# Patient Record
Sex: Female | Born: 1965 | Race: White | Hispanic: No | Marital: Married | Smoking: Never smoker
Health system: Southern US, Community
[De-identification: ages and names within clinical notes are randomized; demographics above are authoritative.]

## PROBLEM LIST (undated history)

## (undated) DIAGNOSIS — K219 Gastro-esophageal reflux disease without esophagitis: Secondary | ICD-10-CM

## (undated) DIAGNOSIS — I10 Essential (primary) hypertension: Secondary | ICD-10-CM

## (undated) DIAGNOSIS — T7840XA Allergy, unspecified, initial encounter: Secondary | ICD-10-CM

## (undated) HISTORY — PX: TONSILLECTOMY: SUR1361

## (undated) HISTORY — DX: Essential (primary) hypertension: I10

## (undated) HISTORY — PX: BACK SURGERY: SHX140

## (undated) HISTORY — DX: Allergy, unspecified, initial encounter: T78.40XA

## (undated) HISTORY — PX: CHOLECYSTECTOMY: SHX55

## (undated) HISTORY — DX: Gastro-esophageal reflux disease without esophagitis: K21.9

---

## 2015-09-28 DIAGNOSIS — Z6831 Body mass index (BMI) 31.0-31.9, adult: Secondary | ICD-10-CM | POA: Diagnosis not present

## 2015-09-28 DIAGNOSIS — G43009 Migraine without aura, not intractable, without status migrainosus: Secondary | ICD-10-CM | POA: Diagnosis not present

## 2015-09-28 DIAGNOSIS — I10 Essential (primary) hypertension: Secondary | ICD-10-CM | POA: Diagnosis not present

## 2016-03-28 DIAGNOSIS — Z1231 Encounter for screening mammogram for malignant neoplasm of breast: Secondary | ICD-10-CM | POA: Diagnosis not present

## 2016-04-25 DIAGNOSIS — Z6832 Body mass index (BMI) 32.0-32.9, adult: Secondary | ICD-10-CM | POA: Diagnosis not present

## 2016-04-25 DIAGNOSIS — L570 Actinic keratosis: Secondary | ICD-10-CM | POA: Diagnosis not present

## 2016-05-27 DIAGNOSIS — Z01419 Encounter for gynecological examination (general) (routine) without abnormal findings: Secondary | ICD-10-CM | POA: Diagnosis not present

## 2016-05-27 DIAGNOSIS — G43001 Migraine without aura, not intractable, with status migrainosus: Secondary | ICD-10-CM | POA: Diagnosis not present

## 2016-10-10 DIAGNOSIS — G43119 Migraine with aura, intractable, without status migrainosus: Secondary | ICD-10-CM | POA: Diagnosis not present

## 2016-10-10 DIAGNOSIS — Z6833 Body mass index (BMI) 33.0-33.9, adult: Secondary | ICD-10-CM | POA: Diagnosis not present

## 2017-02-16 DIAGNOSIS — Z6833 Body mass index (BMI) 33.0-33.9, adult: Secondary | ICD-10-CM | POA: Diagnosis not present

## 2017-02-16 DIAGNOSIS — N763 Subacute and chronic vulvitis: Secondary | ICD-10-CM | POA: Diagnosis not present

## 2017-03-18 DIAGNOSIS — M722 Plantar fascial fibromatosis: Secondary | ICD-10-CM | POA: Diagnosis not present

## 2017-03-18 DIAGNOSIS — M79671 Pain in right foot: Secondary | ICD-10-CM | POA: Diagnosis not present

## 2017-04-07 DIAGNOSIS — Z1231 Encounter for screening mammogram for malignant neoplasm of breast: Secondary | ICD-10-CM | POA: Diagnosis not present

## 2017-04-24 DIAGNOSIS — Z6833 Body mass index (BMI) 33.0-33.9, adult: Secondary | ICD-10-CM | POA: Diagnosis not present

## 2017-04-24 DIAGNOSIS — Z0000 Encounter for general adult medical examination without abnormal findings: Secondary | ICD-10-CM | POA: Diagnosis not present

## 2017-04-24 DIAGNOSIS — G43119 Migraine with aura, intractable, without status migrainosus: Secondary | ICD-10-CM | POA: Diagnosis not present

## 2017-04-24 DIAGNOSIS — M722 Plantar fascial fibromatosis: Secondary | ICD-10-CM | POA: Diagnosis not present

## 2017-04-24 DIAGNOSIS — M79672 Pain in left foot: Secondary | ICD-10-CM | POA: Diagnosis not present

## 2017-05-28 DIAGNOSIS — Z6834 Body mass index (BMI) 34.0-34.9, adult: Secondary | ICD-10-CM | POA: Diagnosis not present

## 2017-05-28 DIAGNOSIS — Z01419 Encounter for gynecological examination (general) (routine) without abnormal findings: Secondary | ICD-10-CM | POA: Diagnosis not present

## 2017-10-02 DIAGNOSIS — R6889 Other general symptoms and signs: Secondary | ICD-10-CM | POA: Diagnosis not present

## 2017-10-02 DIAGNOSIS — R05 Cough: Secondary | ICD-10-CM | POA: Diagnosis not present

## 2017-10-02 DIAGNOSIS — J40 Bronchitis, not specified as acute or chronic: Secondary | ICD-10-CM | POA: Diagnosis not present

## 2017-10-02 DIAGNOSIS — J029 Acute pharyngitis, unspecified: Secondary | ICD-10-CM | POA: Diagnosis not present

## 2017-10-29 DIAGNOSIS — J40 Bronchitis, not specified as acute or chronic: Secondary | ICD-10-CM | POA: Diagnosis not present

## 2017-10-29 DIAGNOSIS — N390 Urinary tract infection, site not specified: Secondary | ICD-10-CM | POA: Diagnosis not present

## 2017-10-29 DIAGNOSIS — M545 Low back pain: Secondary | ICD-10-CM | POA: Diagnosis not present

## 2017-10-29 DIAGNOSIS — G43109 Migraine with aura, not intractable, without status migrainosus: Secondary | ICD-10-CM | POA: Diagnosis not present

## 2018-04-09 DIAGNOSIS — Z1231 Encounter for screening mammogram for malignant neoplasm of breast: Secondary | ICD-10-CM | POA: Diagnosis not present

## 2018-04-28 DIAGNOSIS — Z0000 Encounter for general adult medical examination without abnormal findings: Secondary | ICD-10-CM | POA: Diagnosis not present

## 2018-04-28 DIAGNOSIS — Z6835 Body mass index (BMI) 35.0-35.9, adult: Secondary | ICD-10-CM | POA: Diagnosis not present

## 2018-05-13 DIAGNOSIS — Z6834 Body mass index (BMI) 34.0-34.9, adult: Secondary | ICD-10-CM | POA: Diagnosis not present

## 2018-05-13 DIAGNOSIS — M5431 Sciatica, right side: Secondary | ICD-10-CM | POA: Diagnosis not present

## 2018-05-17 DIAGNOSIS — M5126 Other intervertebral disc displacement, lumbar region: Secondary | ICD-10-CM | POA: Diagnosis not present

## 2018-05-17 DIAGNOSIS — M48061 Spinal stenosis, lumbar region without neurogenic claudication: Secondary | ICD-10-CM | POA: Diagnosis not present

## 2018-05-19 ENCOUNTER — Ambulatory Visit
Admission: RE | Admit: 2018-05-19 | Discharge: 2018-05-19 | Disposition: A | Discharge status: Home / Self Care | Payer: BLUE CROSS/BLUE SHIELD | Source: Ambulatory Visit | Attending: Internal Medicine | Admitting: Internal Medicine

## 2018-05-19 ENCOUNTER — Other Ambulatory Visit: Payer: Self-pay | Admitting: Internal Medicine

## 2018-05-19 DIAGNOSIS — M545 Low back pain, unspecified: Secondary | ICD-10-CM

## 2018-05-19 DIAGNOSIS — M5136 Other intervertebral disc degeneration, lumbar region: Secondary | ICD-10-CM | POA: Diagnosis not present

## 2018-05-19 IMAGING — XA DG EPIDURAL/NERVE ROOT
3 series · 3 of 3 positions shown · IV contrast (isovue)
Comparison: Recent MRI.

CLINICAL DATA: Spondylosis without myelopathy. Right radicular
pain, L2 distribution going to the hip and upper thigh. Imaging
shows left-sided predominant disc pathology at L2-3 and L3-4, but
the patient does not have any left-sided symptoms. Minor left
foraminal encroachment at L2-3 was demonstrated.

EXAM:
Left L2 nerve root block and trans foraminal epidural
TECHNIQUE: The overlying skin was prepped with Betadine and draped in sterile
fashion. Skin anesthesia was carried [DATE]% Lidocaine. A
curved 22 gauge spinal needle was directed into the superior ventral
neural foramen on the right at L2-3.Injection of a few drops of
Isovue 200 demonstrates spread outlining the right L2 nerve root. No
intravascular extension. 120mg Depo-Medrol and 2cc 1% Lidocaine were
subsequently administered. No apparent complication. The patient
tolerated the procedure without difficulty and was transferred to
recovery in excellent condition.
FLUOROSCOPY TIME:  0 minutes 52 seconds. 58.40 micro gray meter
squared

[Series 1: ortho adipose · 1 of 1 slices shown (1 of 3)]
[im 1/1]
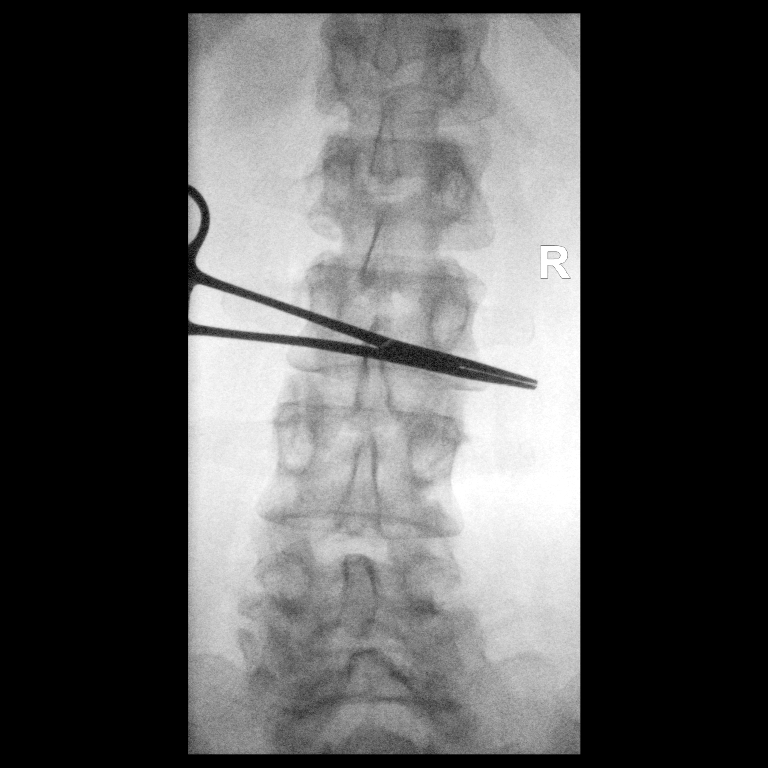

[Series 2: ortho adipose · 1 of 1 slices shown (2 of 3)]
[im 1/1]
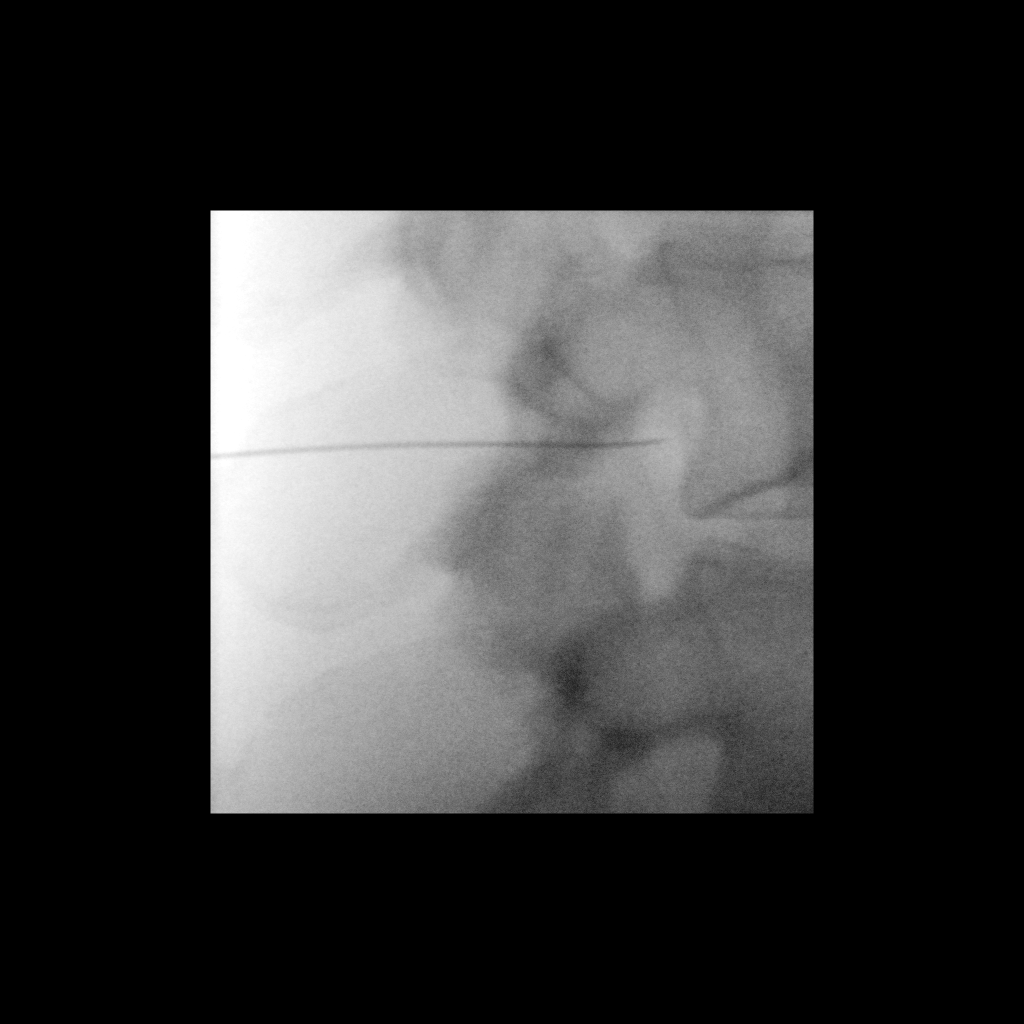

[Series 3: ortho adipose · 1 of 1 slices shown (3 of 3)]
[im 1/1]
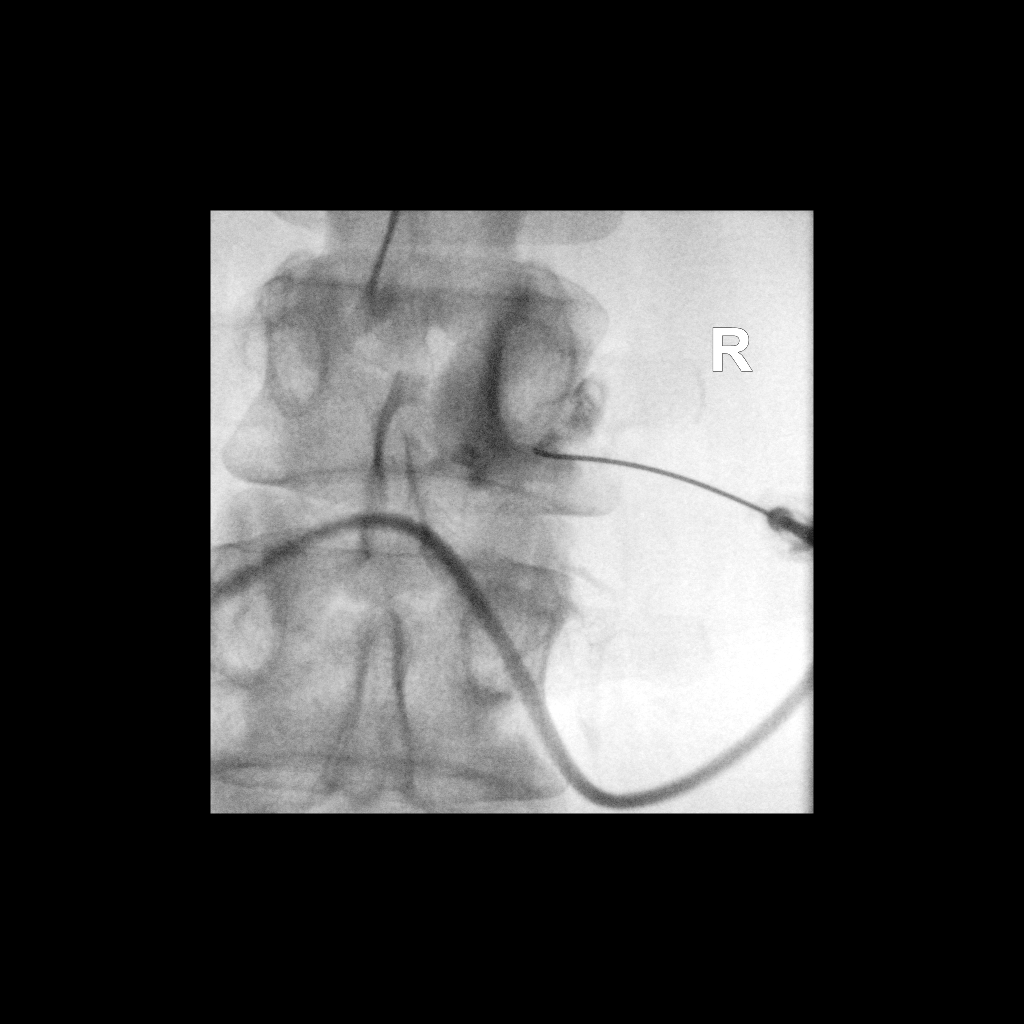

[3 of 3 positions shown; findings below may reference images not displayed]

IMPRESSION: Technically successful right L2 selective nerve root block and
transforaminal epidural steroid injection.

## 2018-05-19 MED ORDER — METHYLPREDNISOLONE ACETATE 40 MG/ML INJ SUSP (RADIOLOG
120.0000 mg | Freq: Once | INTRAMUSCULAR | Status: AC
  Administered 2018-05-19: 120 mg via EPIDURAL

## 2018-05-19 MED ORDER — IOPAMIDOL (ISOVUE-M 200) INJECTION 41%
1.0000 mL | Freq: Once | INTRAMUSCULAR | Status: AC
  Administered 2018-05-19: 1 mL via EPIDURAL

## 2018-05-19 NOTE — Discharge Instructions (Signed)

## 2018-06-14 DIAGNOSIS — Z6834 Body mass index (BMI) 34.0-34.9, adult: Secondary | ICD-10-CM | POA: Diagnosis not present

## 2018-06-14 DIAGNOSIS — Z01419 Encounter for gynecological examination (general) (routine) without abnormal findings: Secondary | ICD-10-CM | POA: Diagnosis not present

## 2018-06-27 DIAGNOSIS — Z1212 Encounter for screening for malignant neoplasm of rectum: Secondary | ICD-10-CM | POA: Diagnosis not present

## 2018-06-27 DIAGNOSIS — Z1211 Encounter for screening for malignant neoplasm of colon: Secondary | ICD-10-CM | POA: Diagnosis not present

## 2018-06-28 DIAGNOSIS — S8265XA Nondisplaced fracture of lateral malleolus of left fibula, initial encounter for closed fracture: Secondary | ICD-10-CM | POA: Diagnosis not present

## 2018-06-28 DIAGNOSIS — S8262XA Displaced fracture of lateral malleolus of left fibula, initial encounter for closed fracture: Secondary | ICD-10-CM | POA: Diagnosis not present

## 2018-06-30 DIAGNOSIS — Z6834 Body mass index (BMI) 34.0-34.9, adult: Secondary | ICD-10-CM | POA: Diagnosis not present

## 2018-06-30 DIAGNOSIS — S93422A Sprain of deltoid ligament of left ankle, initial encounter: Secondary | ICD-10-CM | POA: Diagnosis not present

## 2018-08-04 DIAGNOSIS — Z6835 Body mass index (BMI) 35.0-35.9, adult: Secondary | ICD-10-CM | POA: Diagnosis not present

## 2018-08-04 DIAGNOSIS — J40 Bronchitis, not specified as acute or chronic: Secondary | ICD-10-CM | POA: Diagnosis not present

## 2018-09-01 DIAGNOSIS — Z6835 Body mass index (BMI) 35.0-35.9, adult: Secondary | ICD-10-CM | POA: Diagnosis not present

## 2018-09-01 DIAGNOSIS — J45991 Cough variant asthma: Secondary | ICD-10-CM | POA: Diagnosis not present

## 2018-09-01 DIAGNOSIS — J301 Allergic rhinitis due to pollen: Secondary | ICD-10-CM | POA: Diagnosis not present

## 2018-11-24 DIAGNOSIS — I10 Essential (primary) hypertension: Secondary | ICD-10-CM | POA: Diagnosis not present

## 2018-11-24 DIAGNOSIS — Z6835 Body mass index (BMI) 35.0-35.9, adult: Secondary | ICD-10-CM | POA: Diagnosis not present

## 2018-11-24 DIAGNOSIS — J301 Allergic rhinitis due to pollen: Secondary | ICD-10-CM | POA: Diagnosis not present

## 2018-11-24 DIAGNOSIS — M545 Low back pain: Secondary | ICD-10-CM | POA: Diagnosis not present

## 2019-03-16 DIAGNOSIS — J301 Allergic rhinitis due to pollen: Secondary | ICD-10-CM | POA: Diagnosis not present

## 2019-03-16 DIAGNOSIS — Z6835 Body mass index (BMI) 35.0-35.9, adult: Secondary | ICD-10-CM | POA: Diagnosis not present

## 2019-03-16 DIAGNOSIS — Z79899 Other long term (current) drug therapy: Secondary | ICD-10-CM | POA: Diagnosis not present

## 2019-03-16 DIAGNOSIS — M545 Low back pain: Secondary | ICD-10-CM | POA: Diagnosis not present

## 2019-03-16 DIAGNOSIS — I10 Essential (primary) hypertension: Secondary | ICD-10-CM | POA: Diagnosis not present

## 2019-05-26 DIAGNOSIS — M79671 Pain in right foot: Secondary | ICD-10-CM | POA: Diagnosis not present

## 2019-05-26 DIAGNOSIS — M2011 Hallux valgus (acquired), right foot: Secondary | ICD-10-CM | POA: Diagnosis not present

## 2019-06-17 DIAGNOSIS — Z1231 Encounter for screening mammogram for malignant neoplasm of breast: Secondary | ICD-10-CM | POA: Diagnosis not present

## 2019-06-23 DIAGNOSIS — Z01419 Encounter for gynecological examination (general) (routine) without abnormal findings: Secondary | ICD-10-CM | POA: Diagnosis not present

## 2019-06-23 DIAGNOSIS — Z6835 Body mass index (BMI) 35.0-35.9, adult: Secondary | ICD-10-CM | POA: Diagnosis not present

## 2019-07-20 DIAGNOSIS — Z0000 Encounter for general adult medical examination without abnormal findings: Secondary | ICD-10-CM | POA: Diagnosis not present

## 2019-07-20 DIAGNOSIS — Z6835 Body mass index (BMI) 35.0-35.9, adult: Secondary | ICD-10-CM | POA: Diagnosis not present

## 2019-10-25 DIAGNOSIS — M810 Age-related osteoporosis without current pathological fracture: Secondary | ICD-10-CM | POA: Diagnosis not present

## 2019-10-25 DIAGNOSIS — Z0389 Encounter for observation for other suspected diseases and conditions ruled out: Secondary | ICD-10-CM | POA: Diagnosis not present

## 2019-11-14 DIAGNOSIS — Z6836 Body mass index (BMI) 36.0-36.9, adult: Secondary | ICD-10-CM | POA: Diagnosis not present

## 2019-11-14 DIAGNOSIS — M5431 Sciatica, right side: Secondary | ICD-10-CM | POA: Diagnosis not present

## 2020-01-31 DIAGNOSIS — N390 Urinary tract infection, site not specified: Secondary | ICD-10-CM | POA: Diagnosis not present

## 2020-06-29 DIAGNOSIS — Z1231 Encounter for screening mammogram for malignant neoplasm of breast: Secondary | ICD-10-CM | POA: Diagnosis not present

## 2020-07-04 DIAGNOSIS — R928 Other abnormal and inconclusive findings on diagnostic imaging of breast: Secondary | ICD-10-CM | POA: Diagnosis not present

## 2020-07-04 DIAGNOSIS — Z975 Presence of (intrauterine) contraceptive device: Secondary | ICD-10-CM | POA: Diagnosis not present

## 2020-07-04 DIAGNOSIS — Z6835 Body mass index (BMI) 35.0-35.9, adult: Secondary | ICD-10-CM | POA: Diagnosis not present

## 2020-07-04 DIAGNOSIS — Z01419 Encounter for gynecological examination (general) (routine) without abnormal findings: Secondary | ICD-10-CM | POA: Diagnosis not present

## 2022-10-28 ENCOUNTER — Encounter: Payer: Self-pay | Admitting: General Surgery

## 2022-10-28 ENCOUNTER — Ambulatory Visit (INDEPENDENT_AMBULATORY_CARE_PROVIDER_SITE_OTHER): Payer: No Typology Code available for payment source | Admitting: General Surgery

## 2022-10-28 VITALS — BP 126/83 | HR 58 | Temp 98.2°F | Resp 14 | Ht 64.0 in | Wt 212.0 lb

## 2022-10-28 DIAGNOSIS — Z1211 Encounter for screening for malignant neoplasm of colon: Secondary | ICD-10-CM | POA: Diagnosis not present

## 2022-10-28 MED ORDER — SUTAB 1479-225-188 MG PO TABS: 24.0000 | ORAL_TABLET | Freq: Once | ORAL | 0 refills | Status: AC

## 2022-10-29 NOTE — Progress Notes (Signed)
Paula Singleton; [id number]; 01/07/1966   HPI Patient is a 57-year-old white female who was referred to my care by Dr. Hasanaj for a screening colonoscopy.  Patient has never had a colonoscopy.  She denies any abnormal diarrhea or constipation, abdominal pain, weight change, or blood in her stools.  She has no immediate family history of colon cancer. Past Medical History:  Diagnosis Date   Allergy    GERD (gastroesophageal reflux disease)    Hypertension     Past Surgical History:  Procedure Laterality Date   BACK SURGERY     CHOLECYSTECTOMY     TONSILLECTOMY      Family History  Problem Relation Age of Onset   Hypertension Mother    Stroke Mother    Breast cancer Mother    Diabetes Father    Stroke Father     Current Outpatient Medications on File Prior to Visit  Medication Sig Dispense Refill   gabapentin (NEURONTIN) 300 MG capsule TAKE 1 CAPSULE BY MOUTH THREE TIMES A DAY     nebivolol (BYSTOLIC) 5 MG tablet Take 1 tablet by mouth daily.     traMADol (ULTRAM) 50 MG tablet Take by mouth.     celecoxib (CELEBREX) 200 MG capsule Take 200 mg by mouth daily.     Cholecalciferol 125 MCG (5000 UT) TABS      pantoprazole (PROTONIX) 40 MG tablet Take 40 mg by mouth daily.     No current facility-administered medications on file prior to visit.    No Known Allergies  Social History   Substance and Sexual Activity  Alcohol Use Never    Social History   Tobacco Use  Smoking Status Never   Passive exposure: Past  Smokeless Tobacco Never    Review of Systems  Constitutional: Negative.   HENT: Negative.    Eyes: Negative.   Respiratory: Negative.    Cardiovascular: Negative.   Gastrointestinal: Negative.   Genitourinary: Negative.   Musculoskeletal:  Positive for back pain.  Skin: Negative.   Neurological: Negative.   Endo/Heme/Allergies: Negative.   Psychiatric/Behavioral: Negative.      Objective   Vitals:   10/28/22 1603  BP: 126/83  Pulse: (!) 58   Resp: 14  Temp: 98.2 F (36.8 C)  SpO2: 96%    Physical Exam Vitals reviewed.  Constitutional:      Appearance: Normal appearance. She is not ill-appearing.  HENT:     Head: Normocephalic and atraumatic.  Cardiovascular:     Rate and Rhythm: Normal rate and regular rhythm.     Heart sounds: Normal heart sounds. No murmur heard.    No friction rub. No gallop.  Pulmonary:     Effort: Pulmonary effort is normal. No respiratory distress.     Breath sounds: Normal breath sounds. No stridor. No wheezing, rhonchi or rales.  Abdominal:     General: Bowel sounds are normal. There is no distension.     Palpations: Abdomen is soft. There is no mass.     Tenderness: There is no abdominal tenderness. There is no guarding or rebound.     Hernia: No hernia is present.  Skin:    General: Skin is warm and dry.  Neurological:     Mental Status: She is alert and oriented to person, place, and time.     Assessment  Need for screening colonoscopy Plan  Patient is scheduled for screening colonoscopy on 11/11/2022.  The risks and benefits of the procedure including bleeding and perforation were fully explained   to the patient, who gave informed consent.  Sutabs have been prescribed for bowel preparation. 

## 2022-10-30 NOTE — H&P (Signed)
Paula Singleton; [id number]; 07-19-1965   HPI Patient is a 57 year old white female who was referred to my care by Dr. Olena Leatherwood for a screening colonoscopy.  Patient has never had a colonoscopy.  She denies any abnormal diarrhea or constipation, abdominal pain, weight change, or blood in her stools.  She has no immediate family history of colon cancer. Past Medical History:  Diagnosis Date   Allergy    GERD (gastroesophageal reflux disease)    Hypertension     Past Surgical History:  Procedure Laterality Date   BACK SURGERY     CHOLECYSTECTOMY     TONSILLECTOMY      Family History  Problem Relation Age of Onset   Hypertension Mother    Stroke Mother    Breast cancer Mother    Diabetes Father    Stroke Father     Current Outpatient Medications on File Prior to Visit  Medication Sig Dispense Refill   gabapentin (NEURONTIN) 300 MG capsule TAKE 1 CAPSULE BY MOUTH THREE TIMES A DAY     nebivolol (BYSTOLIC) 5 MG tablet Take 1 tablet by mouth daily.     traMADol (ULTRAM) 50 MG tablet Take by mouth.     celecoxib (CELEBREX) 200 MG capsule Take 200 mg by mouth daily.     Cholecalciferol 125 MCG (5000 UT) TABS      pantoprazole (PROTONIX) 40 MG tablet Take 40 mg by mouth daily.     No current facility-administered medications on file prior to visit.    No Known Allergies  Social History   Substance and Sexual Activity  Alcohol Use Never    Social History   Tobacco Use  Smoking Status Never   Passive exposure: Past  Smokeless Tobacco Never    Review of Systems  Constitutional: Negative.   HENT: Negative.    Eyes: Negative.   Respiratory: Negative.    Cardiovascular: Negative.   Gastrointestinal: Negative.   Genitourinary: Negative.   Musculoskeletal:  Positive for back pain.  Skin: Negative.   Neurological: Negative.   Endo/Heme/Allergies: Negative.   Psychiatric/Behavioral: Negative.      Objective   Vitals:   10/28/22 1603  BP: 126/83  Pulse: (!) 58   Resp: 14  Temp: 98.2 F (36.8 C)  SpO2: 96%    Physical Exam Vitals reviewed.  Constitutional:      Appearance: Normal appearance. She is not ill-appearing.  HENT:     Head: Normocephalic and atraumatic.  Cardiovascular:     Rate and Rhythm: Normal rate and regular rhythm.     Heart sounds: Normal heart sounds. No murmur heard.    No friction rub. No gallop.  Pulmonary:     Effort: Pulmonary effort is normal. No respiratory distress.     Breath sounds: Normal breath sounds. No stridor. No wheezing, rhonchi or rales.  Abdominal:     General: Bowel sounds are normal. There is no distension.     Palpations: Abdomen is soft. There is no mass.     Tenderness: There is no abdominal tenderness. There is no guarding or rebound.     Hernia: No hernia is present.  Skin:    General: Skin is warm and dry.  Neurological:     Mental Status: She is alert and oriented to person, place, and time.     Assessment  Need for screening colonoscopy Plan  Patient is scheduled for screening colonoscopy on 11/11/2022.  The risks and benefits of the procedure including bleeding and perforation were fully explained  to the patient, who gave informed consent.  Sutabs have been prescribed for bowel preparation.

## 2022-11-11 ENCOUNTER — Ambulatory Visit (HOSPITAL_COMMUNITY): Payer: No Typology Code available for payment source | Admitting: Certified Registered"

## 2022-11-11 ENCOUNTER — Ambulatory Visit (HOSPITAL_COMMUNITY)
Admission: RE | Admit: 2022-11-11 | Discharge: 2022-11-11 | Disposition: A | Discharge status: Home / Self Care | Payer: No Typology Code available for payment source | Attending: General Surgery | Admitting: General Surgery

## 2022-11-11 ENCOUNTER — Encounter (HOSPITAL_COMMUNITY): Payer: Self-pay | Admitting: General Surgery

## 2022-11-11 ENCOUNTER — Encounter (HOSPITAL_COMMUNITY)
Admission: RE | Disposition: A | Discharge status: Home / Self Care | Payer: Self-pay | Source: Home / Self Care | Attending: General Surgery

## 2022-11-11 ENCOUNTER — Other Ambulatory Visit: Payer: Self-pay

## 2022-11-11 ENCOUNTER — Ambulatory Visit (HOSPITAL_BASED_OUTPATIENT_CLINIC_OR_DEPARTMENT_OTHER): Payer: No Typology Code available for payment source | Admitting: Certified Registered"

## 2022-11-11 DIAGNOSIS — Z6836 Body mass index (BMI) 36.0-36.9, adult: Secondary | ICD-10-CM | POA: Diagnosis not present

## 2022-11-11 DIAGNOSIS — I10 Essential (primary) hypertension: Secondary | ICD-10-CM

## 2022-11-11 DIAGNOSIS — Z1211 Encounter for screening for malignant neoplasm of colon: Secondary | ICD-10-CM

## 2022-11-11 DIAGNOSIS — K219 Gastro-esophageal reflux disease without esophagitis: Secondary | ICD-10-CM | POA: Diagnosis not present

## 2022-11-11 HISTORY — PX: COLONOSCOPY WITH PROPOFOL: SHX5780

## 2022-11-11 SURGERY — COLONOSCOPY WITH PROPOFOL
Anesthesia: General

## 2022-11-11 MED ORDER — LACTATED RINGERS IV SOLN: INTRAVENOUS | Status: DC

## 2022-11-11 MED ORDER — PROPOFOL 10 MG/ML IV BOLUS
INTRAVENOUS | Status: DC | PRN
  Administered 2022-11-11 (×2): 50 mg via INTRAVENOUS
  Administered 2022-11-11: 150 mg via INTRAVENOUS
  Administered 2022-11-11: 50 mg via INTRAVENOUS

## 2022-11-11 MED ORDER — CHLORHEXIDINE GLUCONATE CLOTH 2 % EX PADS: 6.0000 | MEDICATED_PAD | Freq: Once | CUTANEOUS | Status: DC

## 2022-11-11 MED ORDER — LIDOCAINE HCL (CARDIAC) PF 100 MG/5ML IV SOSY
PREFILLED_SYRINGE | INTRAVENOUS | Status: DC | PRN
  Administered 2022-11-11: 50 mg via INTRAVENOUS

## 2022-11-11 NOTE — Anesthesia Procedure Notes (Signed)
Date/Time: 11/11/2022 7:32 AM  Performed by: Julian Reil, CRNAPre-anesthesia Checklist: Patient identified, Emergency Drugs available, Suction available and Patient being monitored Patient Re-evaluated:Patient Re-evaluated prior to induction Oxygen Delivery Method: Nasal cannula Induction Type: IV induction Placement Confirmation: positive ETCO2

## 2022-11-11 NOTE — Anesthesia Preprocedure Evaluation (Signed)
Anesthesia Evaluation  Patient identified by MRN, date of birth, ID band Patient awake    Reviewed: Allergy & Precautions, H&P , NPO status , Patient's Chart, lab work & pertinent test results, reviewed documented beta blocker date and time   Airway Mallampati: II  TM Distance: >3 FB Neck ROM: full    Dental no notable dental hx.    Pulmonary neg pulmonary ROS   Pulmonary exam normal breath sounds clear to auscultation       Cardiovascular Exercise Tolerance: Good hypertension, negative cardio ROS  Rhythm:regular Rate:Normal     Neuro/Psych negative neurological ROS  negative psych ROS   GI/Hepatic negative GI ROS, Neg liver ROS,GERD  ,,  Endo/Other    Morbid obesity  Renal/GU negative Renal ROS  negative genitourinary   Musculoskeletal   Abdominal   Peds  Hematology negative hematology ROS (+)   Anesthesia Other Findings   Reproductive/Obstetrics negative OB ROS                             Anesthesia Physical Anesthesia Plan  ASA: 2  Anesthesia Plan: General   Post-op Pain Management:    Induction:   PONV Risk Score and Plan: Propofol infusion  Airway Management Planned:   Additional Equipment:   Intra-op Plan:   Post-operative Plan:   Informed Consent: I have reviewed the patients History and Physical, chart, labs and discussed the procedure including the risks, benefits and alternatives for the proposed anesthesia with the patient or authorized representative who has indicated his/her understanding and acceptance.     Dental Advisory Given  Plan Discussed with: CRNA  Anesthesia Plan Comments:        Anesthesia Quick Evaluation

## 2022-11-11 NOTE — Op Note (Signed)
[facility] Patient Name: Paula Singleton Procedure Date: 11/11/2022 7:03 AM MRN: [id number] Date of Birth: 03/25/1966 Attending MD: [name] , MD, [id number] CSN: [id number] Age: 57 Admit Type: Outpatient Procedure:                Colonoscopy Indications:              Screening for colorectal malignant neoplasm Providers:                [name], MD, Jannett Celestine, RN, Lennice Sites                            Technician, Technician Referring MD:              Medicines:                Propofol per Anesthesia Complications:            No immediate complications. Estimated Blood Loss:     Estimated blood loss: none. Procedure:                Pre-Anesthesia Assessment:                           - Prior to the procedure, a History and Physical                            was performed, and patient medications and                            allergies were reviewed. The patient is competent.                            The risks and benefits of the procedure and the                            sedation options and risks were discussed with the                            patient. All questions were answered and informed                            consent was obtained. Patient identification and                            proposed procedure were verified by the physician,                            the nurse, the anesthesiologist, the anesthetist                            and the technician in the endoscopy suite. Mental                            Status Examination: alert and oriented. Airway  Examination: normal oropharyngeal airway and neck                            mobility. Respiratory Examination: clear to                            auscultation. CV Examination: RRR, no murmurs, no                            S3 or S4. Prophylactic Antibiotics: The patient                            does not require prophylactic antibiotics. Prior                             Anticoagulants: The patient has taken no                            anticoagulant or antiplatelet agents. ASA Grade                            Assessment: II - A patient with mild systemic                            disease. After reviewing the risks and benefits,                            the patient was deemed in satisfactory condition to                            undergo the procedure. The anesthesia plan was to                            use deep sedation / analgesia. Immediately prior to                            administration of medications, the patient was                            re-assessed for adequacy to receive sedatives. The                            heart rate, respiratory rate, oxygen saturations,                            blood pressure, adequacy of pulmonary ventilation,                            and response to care were monitored throughout the                            procedure. The physical status of the patient was  re-assessed after the procedure.                           After obtaining informed consent, the colonoscope                            was passed under direct vision. Throughout the                            procedure, the patient's blood pressure, pulse, and                            oxygen saturations were monitored continuously. The                            [id number] scope was introduced through the                            anus and advanced to the the cecum, identified by                            the appendiceal orifice, ileocecal valve and                            palpation. No anatomical landmarks were                            photographed. The colonoscopy was performed without                            difficulty. The patient tolerated the procedure                            well. The quality of the bowel preparation was                            adequate. The total duration of the  procedure was                            22 minutes. Scope In: 7:29:07 AM Scope Out: 7:41:45 AM Scope Withdrawal Time: 0 hours 4 minutes 8 seconds  Total Procedure Duration: 0 hours 12 minutes 38 seconds  Findings:      The entire examined colon appeared normal on direct and retroflexion       views. Impression:               - The entire examined colon is normal on direct and                            retroflexion views.                           - No specimens collected. Moderate Sedation:      Moderate (conscious) sedation was administered by the nurse and       supervised by the endoscopist. The patient's  oxygen saturation, heart       rate, blood pressure and response to care were monitored. Recommendation:           - Written discharge instructions were provided to                            the patient.                           - The signs and symptoms of potential delayed                            complications were discussed with the patient.                           - Patient has a contact number available for                            emergencies.                           - Return to normal activities tomorrow.                           - Resume previous diet.                           - Continue present medications.                           - Repeat colonoscopy in 10 years for screening                            purposes. Procedure Code(s):        --- Professional ---                           [phone], Colonoscopy, flexible; diagnostic, including                            collection of specimen(s) by brushing or washing,                            when performed (separate procedure) Diagnosis Code(s):        --- Professional ---                           Z12.11, Encounter for screening for malignant                            neoplasm of colon CPT copyright 2022 [employer]. All rights reserved. The codes documented in this report are preliminary  and upon coder review may  be revised to meet current compliance requirements. [name], MD [name], MD 11/11/2022 7:45:24 AM This report has been signed electronically. Number of Addenda: 0

## 2022-11-11 NOTE — Transfer of Care (Signed)
Immediate Anesthesia Transfer of Care Note  Patient: Paula Singleton  Procedure(s) Performed: COLONOSCOPY WITH PROPOFOL  Patient Location: PACU and Endoscopy Unit  Anesthesia Type:General  Level of Consciousness: drowsy  Airway & Oxygen Therapy: Patient Spontanous Breathing  Post-op Assessment: Report given to RN and Post -op Vital signs reviewed and stable  Post vital signs: Reviewed and stable  Last Vitals:  Vitals Value Taken Time  BP    Temp    Pulse    Resp    SpO2      Last Pain:  Vitals:   11/11/22 0725  TempSrc:   PainSc: 0-No pain      Patients Stated Pain Goal: 6 (11/11/22 0725)  Complications: No notable events documented.

## 2022-11-11 NOTE — Interval H&P Note (Signed)
History and Physical Interval Note:  11/11/2022 7:24 AM  Paula Singleton  has presented today for surgery, with the diagnosis of SCREENING FOR COLON CANCER.  The various methods of treatment have been discussed with the patient and family. After consideration of risks, benefits and other options for treatment, the patient has consented to  Procedure(s): COLONOSCOPY WITH PROPOFOL (N/A) as a surgical intervention.  The patient's history has been reviewed, patient examined, no change in status, stable for surgery.  I have reviewed the patient's chart and labs.  Questions were answered to the patient's satisfaction.     [name]

## 2022-11-13 NOTE — Anesthesia Postprocedure Evaluation (Signed)
Anesthesia Post Note  Patient: Paula Singleton  Procedure(s) Performed: COLONOSCOPY WITH PROPOFOL  Patient location during evaluation: Phase II Anesthesia Type: General Level of consciousness: awake Pain management: pain level controlled Vital Signs Assessment: post-procedure vital signs reviewed and stable Respiratory status: spontaneous breathing and respiratory function stable Cardiovascular status: blood pressure returned to baseline and stable Postop Assessment: no headache and no apparent nausea or vomiting Anesthetic complications: no Comments: Late entry   No notable events documented.   Last Vitals:  Vitals:   11/11/22 0652 11/11/22 0744  BP: 136/75 110/60  Pulse: 73 70  Resp: 18 20  Temp: 36.7 C (!) 36.1 C  SpO2: 96% 94%    Last Pain:  Vitals:   11/11/22 0744  TempSrc: Axillary  PainSc: 0-No pain                 Windell Norfolk

## 2022-11-18 ENCOUNTER — Encounter (HOSPITAL_COMMUNITY): Payer: Self-pay | Admitting: General Surgery

## 2024-07-11 ENCOUNTER — Ambulatory Visit

## 2024-07-25 ENCOUNTER — Ambulatory Visit: Admitting: Physical Therapy
